# Patient Record
Sex: Female | Born: 1960 | Race: White | Hispanic: No | Marital: Married | Smoking: Never smoker
Health system: Southern US, Community
[De-identification: ages and names within clinical notes are randomized; demographics above are authoritative.]

## PROBLEM LIST (undated history)

## (undated) ENCOUNTER — Ambulatory Visit

---

## 2006-01-05 ENCOUNTER — Emergency Department (HOSPITAL_COMMUNITY): Admission: EM | Admit: 2006-01-05 | Discharge: 2006-01-05 | Payer: Self-pay | Admitting: Emergency Medicine

## 2007-10-21 ENCOUNTER — Emergency Department (HOSPITAL_COMMUNITY): Admission: EM | Admit: 2007-10-21 | Discharge: 2007-10-21 | Payer: Self-pay | Admitting: Emergency Medicine

## 2007-10-22 ENCOUNTER — Ambulatory Visit (HOSPITAL_BASED_OUTPATIENT_CLINIC_OR_DEPARTMENT_OTHER): Admission: RE | Admit: 2007-10-22 | Discharge: 2007-10-22 | Payer: Self-pay | Admitting: Family Medicine

## 2008-02-24 ENCOUNTER — Encounter: Admission: RE | Admit: 2008-02-24 | Discharge: 2008-02-24 | Payer: Self-pay | Admitting: Family Medicine

## 2008-03-04 ENCOUNTER — Encounter: Admission: RE | Admit: 2008-03-04 | Discharge: 2008-03-04 | Payer: Self-pay | Admitting: Family Medicine

## 2008-07-23 ENCOUNTER — Inpatient Hospital Stay (HOSPITAL_COMMUNITY): Admission: AD | Admit: 2008-07-23 | Discharge: 2008-07-24 | Payer: Self-pay | Admitting: *Deleted

## 2008-07-23 ENCOUNTER — Emergency Department (HOSPITAL_COMMUNITY): Admission: EM | Admit: 2008-07-23 | Discharge: 2008-07-23 | Payer: Self-pay | Admitting: Emergency Medicine

## 2008-07-23 ENCOUNTER — Ambulatory Visit: Payer: Self-pay | Admitting: *Deleted
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
## 2010-02-12 ENCOUNTER — Encounter: Payer: Self-pay | Admitting: Family Medicine

## 2010-04-30 LAB — CBC
HCT: 40 % (ref 36.0–46.0)
MCV: 88.8 fL (ref 78.0–100.0)
Platelets: 316 10*3/uL (ref 150–400)
RDW: 13.7 % (ref 11.5–15.5)

## 2010-04-30 LAB — BASIC METABOLIC PANEL
BUN: 17 mg/dL (ref 6–23)
Calcium: 9.3 mg/dL (ref 8.4–10.5)
Creatinine, Ser: 0.78 mg/dL (ref 0.4–1.2)
Glucose, Bld: 114 mg/dL — ABNORMAL HIGH (ref 70–99)
Sodium: 141 mEq/L (ref 135–145)

## 2010-04-30 LAB — RAPID URINE DRUG SCREEN, HOSP PERFORMED
Barbiturates: NOT DETECTED
Benzodiazepines: NOT DETECTED
Cocaine: NOT DETECTED
Tetrahydrocannabinol: NOT DETECTED

## 2010-04-30 LAB — URINALYSIS, ROUTINE W REFLEX MICROSCOPIC
Glucose, UA: NEGATIVE mg/dL
Urobilinogen, UA: 0.2 mg/dL (ref 0.0–1.0)

## 2010-04-30 LAB — DIFFERENTIAL
Basophils Absolute: 0.1 10*3/uL (ref 0.0–0.1)
Lymphocytes Relative: 33 % (ref 12–46)
Monocytes Absolute: 0.6 10*3/uL (ref 0.1–1.0)
Neutrophils Relative %: 58 % (ref 43–77)

## 2010-04-30 LAB — HEPATIC FUNCTION PANEL
AST: 20 U/L (ref 0–37)
Albumin: 4.1 g/dL (ref 3.5–5.2)
Alkaline Phosphatase: 55 U/L (ref 39–117)
Total Bilirubin: 0.5 mg/dL (ref 0.3–1.2)
Total Protein: 6.9 g/dL (ref 6.0–8.3)

## 2010-04-30 LAB — ACETAMINOPHEN LEVEL: Acetaminophen (Tylenol), Serum: <10 ug/mL — ABNORMAL LOW (ref 10–30)

## 2010-06-05 ENCOUNTER — Other Ambulatory Visit: Payer: Self-pay | Admitting: Family Medicine

## 2010-06-05 DIAGNOSIS — Z1231 Encounter for screening mammogram for malignant neoplasm of breast: Secondary | ICD-10-CM
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
## 2010-07-14 ENCOUNTER — Ambulatory Visit: Payer: Self-pay

## 2015-08-02 DIAGNOSIS — F419 Anxiety disorder, unspecified: Secondary | ICD-10-CM | POA: Insufficient documentation

## 2015-08-02 DIAGNOSIS — M810 Age-related osteoporosis without current pathological fracture: Secondary | ICD-10-CM | POA: Insufficient documentation

## 2015-08-02 DIAGNOSIS — R739 Hyperglycemia, unspecified: Secondary | ICD-10-CM | POA: Insufficient documentation

## 2015-08-02 DIAGNOSIS — M1990 Unspecified osteoarthritis, unspecified site: Secondary | ICD-10-CM | POA: Insufficient documentation

## 2015-08-02 DIAGNOSIS — E785 Hyperlipidemia, unspecified: Secondary | ICD-10-CM | POA: Insufficient documentation

## 2018-08-17 DIAGNOSIS — J029 Acute pharyngitis, unspecified: Secondary | ICD-10-CM | POA: Insufficient documentation

## 2018-08-17 DIAGNOSIS — Z20822 Contact with and (suspected) exposure to covid-19: Secondary | ICD-10-CM | POA: Insufficient documentation

## 2019-04-04 ENCOUNTER — Ambulatory Visit: Payer: Self-pay

## 2019-08-19 ENCOUNTER — Ambulatory Visit: Payer: No Typology Code available for payment source | Admitting: Physician Assistant

## 2019-08-19 ENCOUNTER — Other Ambulatory Visit: Payer: Self-pay

## 2019-08-19 ENCOUNTER — Encounter: Payer: Self-pay | Admitting: Physician Assistant

## 2019-08-19 DIAGNOSIS — D485 Neoplasm of uncertain behavior of skin: Secondary | ICD-10-CM

## 2019-08-19 NOTE — Patient Instructions (Signed)

## 2019-08-19 NOTE — Progress Notes (Signed)
   Follow up Visit  Subjective  Avalyn Molino is a 59 y.o. female who presents for the following: Skin Problem (itchy spot on upper thigh, irritated moles on back). Bump on right outer thigh. Noticed 4-5 weeks ago. It itches and is different than the other keratoses on her legs. She also has irritated areas on her bra line each side of back.   Objective  Well appearing patient in no apparent distress; mood and affect are within normal limits.  A focused examination was performed including back and right hip. Relevant physical exam findings are noted in the Assessment and Plan.   Objective  Right Thigh - Anterior: Pink papule     Objective  Left Flank: Thick brown inflamed papule     Objective  Right Lower Back: Inflamed brown patch     Assessment & Plan  Neoplasm of uncertain behavior of skin (3) Right Thigh - Anterior  Skin / nail biopsy Type of biopsy: tangential   Informed consent: discussed and consent obtained   Timeout: patient name, date of birth, surgical site, and procedure verified   Procedure prep:  Patient was prepped and draped in usual sterile fashion (Non sterile) Prep type:  Chlorhexidine Anesthesia: the lesion was anesthetized in a standard fashion   Anesthetic:  1% lidocaine w/ epinephrine 1-100,000 local infiltration Instrument used: flexible razor blade   Outcome: patient tolerated procedure well   Post-procedure details: wound care instructions given    Specimen 1 - Surgical pathology Differential Diagnosis: SCC vs BCC Check Margins: No  Left Flank  Skin / nail biopsy Type of biopsy: tangential   Informed consent: discussed and consent obtained   Timeout: patient name, date of birth, surgical site, and procedure verified   Procedure prep:  Patient was prepped and draped in usual sterile fashion (Non sterile) Prep type:  Chlorhexidine Anesthesia: the lesion was anesthetized in a standard fashion   Anesthetic:  1% lidocaine w/  epinephrine 1-100,000 local infiltration Instrument used: flexible razor blade   Outcome: patient tolerated procedure well   Post-procedure details: wound care instructions given    Specimen 2 - Surgical pathology Differential Diagnosis: SCC vs BCC Check Margins: No  Right Lower Back  Skin / nail biopsy Type of biopsy: tangential   Informed consent: discussed and consent obtained   Timeout: patient name, date of birth, surgical site, and procedure verified   Procedure prep:  Patient was prepped and draped in usual sterile fashion (Non sterile) Prep type:  Chlorhexidine Anesthesia: the lesion was anesthetized in a standard fashion   Anesthetic:  1% lidocaine w/ epinephrine 1-100,000 local infiltration Instrument used: flexible razor blade   Outcome: patient tolerated procedure well   Post-procedure details: wound care instructions given    Specimen 3 - Surgical pathology Differential Diagnosis: SCC vs BCC Check Margins: No
